# Patient Record
Sex: Female | Born: 2005 | Race: Black or African American | Hispanic: No | Marital: Single | Smoking: Never smoker
Health system: Southern US, Community
[De-identification: ages and names within clinical notes are randomized; demographics above are authoritative.]

---
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
## 2024-02-04 ENCOUNTER — Ambulatory Visit

## 2024-04-26 ENCOUNTER — Other Ambulatory Visit: Payer: Self-pay | Admitting: Physician Assistant

## 2024-04-26 DIAGNOSIS — N921 Excessive and frequent menstruation with irregular cycle: Secondary | ICD-10-CM

## 2024-04-29 ENCOUNTER — Ambulatory Visit
Admission: RE | Admit: 2024-04-29 | Discharge: 2024-04-29 | Disposition: A | Discharge status: Home / Self Care | Source: Ambulatory Visit | Attending: Physician Assistant | Admitting: Physician Assistant

## 2024-04-29 DIAGNOSIS — N921 Excessive and frequent menstruation with irregular cycle: Secondary | ICD-10-CM | POA: Diagnosis present

## 2024-05-31 ENCOUNTER — Emergency Department (HOSPITAL_COMMUNITY)
Admission: EM | Admit: 2024-05-31 | Discharge: 2024-05-31 | Disposition: A | Discharge status: Home / Self Care | Attending: Emergency Medicine | Admitting: Emergency Medicine

## 2024-05-31 ENCOUNTER — Encounter (HOSPITAL_COMMUNITY): Payer: Self-pay | Admitting: Emergency Medicine

## 2024-05-31 ENCOUNTER — Other Ambulatory Visit: Payer: Self-pay

## 2024-05-31 DIAGNOSIS — R202 Paresthesia of skin: Secondary | ICD-10-CM | POA: Insufficient documentation

## 2024-05-31 DIAGNOSIS — R1031 Right lower quadrant pain: Secondary | ICD-10-CM | POA: Diagnosis present

## 2024-05-31 DIAGNOSIS — R109 Unspecified abdominal pain: Secondary | ICD-10-CM

## 2024-05-31 LAB — CBC
HCT: 36.9 % (ref 36.0–46.0)
Hemoglobin: 11.4 g/dL — ABNORMAL LOW (ref 12.0–15.0)
MCH: 29.6 pg (ref 26.0–34.0)
MCHC: 30.9 g/dL (ref 30.0–36.0)
MCV: 95.8 fL (ref 80.0–100.0)
Platelets: 333 K/uL (ref 150–400)
RBC: 3.85 MIL/uL — ABNORMAL LOW (ref 3.87–5.11)
RDW: 11.9 % (ref 11.5–15.5)
WBC: 7.9 K/uL (ref 4.0–10.5)
nRBC: 0 % (ref 0.0–0.2)

## 2024-05-31 LAB — URINALYSIS, ROUTINE W REFLEX MICROSCOPIC
Bilirubin Urine: NEGATIVE
Glucose, UA: NEGATIVE mg/dL
Hgb urine dipstick: NEGATIVE
Ketones, ur: NEGATIVE mg/dL
Leukocytes,Ua: NEGATIVE
Nitrite: NEGATIVE
Protein, ur: NEGATIVE mg/dL
Specific Gravity, Urine: 1.026 (ref 1.005–1.030)
pH: 5 (ref 5.0–8.0)

## 2024-05-31 LAB — COMPREHENSIVE METABOLIC PANEL WITH GFR
ALT: 18 U/L (ref 0–44)
AST: 19 U/L (ref 15–41)
Albumin: 3.9 g/dL (ref 3.5–5.0)
Alkaline Phosphatase: 85 U/L (ref 38–126)
Anion gap: 8 (ref 5–15)
BUN: 13 mg/dL (ref 6–20)
CO2: 26 mmol/L (ref 22–32)
Calcium: 9.3 mg/dL (ref 8.9–10.3)
Chloride: 107 mmol/L (ref 98–111)
Creatinine, Ser: 0.7 mg/dL (ref 0.44–1.00)
GFR, Estimated: >60 mL/min (ref >60)
Glucose, Bld: 96 mg/dL (ref 70–99)
Potassium: 4 mmol/L (ref 3.5–5.1)
Sodium: 140 mmol/L (ref 135–145)
Total Bilirubin: 0.6 mg/dL (ref 0.0–1.2)
Total Protein: 6.9 g/dL (ref 6.5–8.1)

## 2024-05-31 LAB — LIPASE, BLOOD: Lipase: 21 U/L (ref 11–51)

## 2024-05-31 LAB — HCG, SERUM, QUALITATIVE: Preg, Serum: NEGATIVE

## 2024-05-31 NOTE — ED Provider Notes (Signed)
 [facility] Emergency Department Provider Note MRN:  [id number]  Arrival date & time: 05/31/24     Chief Complaint   Abdominal Pain   History of Present Illness   Sherri Sanchez is a 18 y.o. year-old female with no pertinent past medical history presenting to the ED with chief complaint of abdominal pain.  Right lower quadrant abdominal pain on and off for quite some time, became worse about 4 hours ago.  Worse than normal.  Also having paresthesias and tingling to the bilateral legs for the past 3 days.  This is also been going on intermittently for the past several weeks or months.  Denies fever, denies nausea or vomiting, no diarrhea or constipation, no vaginal bleeding or discharge, no dysuria or hematuria.  She put her symptoms in [employer] and now is concerned that she has cancer.  Review of Systems  A thorough review of systems was obtained and all systems are negative except as noted in the HPI and PMH.   Patient's Health History   History reviewed. No pertinent past medical history.  History reviewed. No pertinent surgical history.  History reviewed. No pertinent family history.  Social History   Socioeconomic History   Marital status: Single    Spouse name: Not on file   Number of children: Not on file   Years of education: Not on file   Highest education level: Not on file  Occupational History   Not on file  Tobacco Use   Smoking status: Never   Smokeless tobacco: Not on file  Substance and Sexual Activity   Alcohol use: Not Currently   Drug use: Not on file   Sexual activity: Not on file  Other Topics Concern   Not on file  Social History Narrative   Not on file   Social Drivers of Health   Financial Resource Strain: Low Risk  (05/05/2024)   Received from [facility]   Overall Financial Resource Strain (CARDIA)    Difficulty of Paying Living Expenses: Not hard at all  Food Insecurity: No Food Insecurity (05/05/2024)    Received from [facility]   Hunger Vital Sign    Within the past 12 months, you worried that your food would run out before you got the money to buy more.: Never true    Within the past 12 months, the food you bought just didn't last and you didn't have money to get more.: Never true  Transportation Needs: No Transportation Needs (05/05/2024)   Received from [facility] - Transportation    In the past 12 months, has lack of transportation kept you from medical appointments or from getting medications?: No    Lack of Transportation (Non-Medical): No  Physical Activity: Inactive (09/18/2023)   Received from [facility]   Exercise Vital Sign    On average, how many days per week do you engage in moderate to strenuous exercise (like a brisk walk)?: 0 days    On average, how many minutes do you engage in exercise at this level?: 0 min  Stress: Stress Concern Present (09/18/2023)   Received from [facility] - Occupational Stress Questionnaire    Feeling of Stress : To some extent  Social Connections: Socially Isolated (09/18/2023)   Received from [facility]   Social Connection and Isolation Panel    In a typical week, how many times do you  talk on the phone with family, friends, or neighbors?: Three times a week    How often do you get together with friends or relatives?: Three times a week    How often do you attend church or religious services?: Never    Do you belong to any clubs or organizations such as church groups, unions, fraternal or athletic groups, or school groups?: No    How often do you attend meetings of the clubs or organizations you belong to?: Never    Are you married, widowed, divorced, separated, never married, or living with a partner?: Never married  Intimate Partner Violence: Not At Risk (09/18/2023)   Received from [facility]   Humiliation, Afraid, Rape, and Kick  questionnaire    Within the last year, have you been afraid of your partner or ex-partner?: No    Within the last year, have you been humiliated or emotionally abused in other ways by your partner or ex-partner?: No    Within the last year, have you been kicked, hit, slapped, or otherwise physically hurt by your partner or ex-partner?: No    Within the last year, have you been raped or forced to have any kind of sexual activity by your partner or ex-partner?: No     Physical Exam   Vitals:   05/31/24 0031 05/31/24 0300  BP: 139/81 (!) 138/93  Pulse: 79 76  Resp: 18   Temp: 97.8 F (36.6 C)   SpO2: 100% 99%    CONSTITUTIONAL: Well-appearing, NAD NEURO/PSYCH:  Alert and oriented x 3, no focal deficits EYES:  eyes equal and reactive ENT/NECK:  no LAD, no JVD CARDIO: Regular rate, well-perfused, normal S1 and S2 PULM:  CTAB no wheezing or rhonchi GI/GU:  non-distended, non-tender MSK/SPINE:  No gross deformities, no edema SKIN:  no rash, atraumatic   *Additional and/or pertinent findings included in MDM below  Diagnostic and Interventional Summary    EKG Interpretation Date/Time:    Ventricular Rate:    PR Interval:    QRS Duration:    QT Interval:    QTC Calculation:   R Axis:      Text Interpretation:         Labs Reviewed  CBC - Abnormal; Notable for the following components:      Result Value   RBC 3.85 (*)    Hemoglobin 11.4 (*)    All other components within normal limits  LIPASE, BLOOD  COMPREHENSIVE METABOLIC PANEL WITH GFR  URINALYSIS, ROUTINE W REFLEX MICROSCOPIC  HCG, SERUM, QUALITATIVE    No orders to display    Medications - No data to display   Procedures  /  Critical Care Procedures  ED Course and Medical Decision Making  Initial Impression and Ddx Very well-appearing in no acute distress with normal vital signs.  Abdomen is reassuring, soft, nontender, no rebound guarding or rigidity.  Past medical/surgical history that increases  complexity of ED encounter: None  Interpretation of Diagnostics I personally reviewed the Laboratory Testing and my interpretation is as follows: No significant blood count or electrolyte disturbance.    Patient Reassessment and Ultimate Disposition/Management     Patient reassessed and abdominal exam continues to be reassuring, soft and nontender with no rebound guarding or rigidity.  Overall low concern for emergent process such as appendicitis.  Shared decision making utilized and CT imaging offered, patient prefers close follow-up and return precautions.  Appropriate for discharge.  Patient management required discussion with the following services or consulting  groups:  None  Complexity of Problems Addressed Acute illness or injury that poses threat of life of bodily function  Additional Data Reviewed and Analyzed Further history obtained from: Further history from spouse/family member  Additional Factors Impacting ED Encounter Risk Consideration of hospitalization  Ozell HERO. Theadore, MD [facility] Emergency Medicine [facility] [email or web address]  Final Clinical Impressions(s) / ED Diagnoses     ICD-10-CM   1. Abdominal pain, unspecified abdominal location  R10.9     2. Paresthesia  R20.2       ED Discharge Orders     None        Discharge Instructions Discussed with and Provided to Patient:     Discharge Instructions      You were evaluated in the Emergency Department and after careful evaluation, we did not find any emergent condition requiring admission or further testing in the hospital.  Your exam/testing today is overall reassuring.  Recommend follow-up with your primary care doctor to discuss her symptoms.  If you do not have a primary care doctor you can use [email or web address] to find one.  Please return to the Emergency Department if you experience any worsening of your condition.   Thank you for allowing us  to be a part  of your care.       Theadore Ozell HERO, MD 05/31/24 [phone]

## 2024-05-31 NOTE — Discharge Instructions (Signed)
 You were evaluated in the Emergency Department and after careful evaluation, we did not find any emergent condition requiring admission or further testing in the hospital.  Your exam/testing today is overall reassuring.  Recommend follow-up with your primary care doctor to discuss her symptoms.  If you do not have a primary care doctor you can use [email or web address] to find one.  Please return to the Emergency Department if you experience any worsening of your condition.   Thank you for allowing us  to be a part of your care.

## 2024-05-31 NOTE — ED Notes (Signed)
Recollect sent to lab

## 2024-05-31 NOTE — ED Triage Notes (Signed)
 Patient c/p RLQ abdominal pain and tingling sensation on bilateral legs x 1 day. Patient report nausea denies vomiting. Patient denies dysuria.

## 2024-08-09 ENCOUNTER — Emergency Department (HOSPITAL_COMMUNITY)
Admission: EM | Admit: 2024-08-09 | Discharge: 2024-08-10 | Disposition: A | Discharge status: Home / Self Care | Attending: Emergency Medicine | Admitting: Emergency Medicine

## 2024-08-09 ENCOUNTER — Other Ambulatory Visit: Payer: Self-pay

## 2024-08-09 ENCOUNTER — Encounter (HOSPITAL_COMMUNITY): Payer: Self-pay | Admitting: Emergency Medicine

## 2024-08-09 DIAGNOSIS — Z3A08 8 weeks gestation of pregnancy: Secondary | ICD-10-CM | POA: Diagnosis not present

## 2024-08-09 DIAGNOSIS — O26891 Other specified pregnancy related conditions, first trimester: Secondary | ICD-10-CM | POA: Diagnosis present

## 2024-08-09 DIAGNOSIS — R109 Unspecified abdominal pain: Secondary | ICD-10-CM | POA: Insufficient documentation

## 2024-08-09 LAB — COMPREHENSIVE METABOLIC PANEL WITH GFR
ALT: 9 U/L (ref 0–44)
AST: 16 U/L (ref 15–41)
Albumin: 4.2 g/dL (ref 3.5–5.0)
Alkaline Phosphatase: 87 U/L (ref 38–126)
Anion gap: 9 (ref 5–15)
BUN: 9 mg/dL (ref 6–20)
CO2: 22 mmol/L (ref 22–32)
Calcium: 9.5 mg/dL (ref 8.9–10.3)
Chloride: 104 mmol/L (ref 98–111)
Creatinine, Ser: 0.63 mg/dL (ref 0.44–1.00)
GFR, Estimated: >60 mL/min
Glucose, Bld: 97 mg/dL (ref 70–99)
Potassium: 3.6 mmol/L (ref 3.5–5.1)
Sodium: 135 mmol/L (ref 135–145)
Total Bilirubin: 0.7 mg/dL (ref 0.0–1.2)
Total Protein: 7.3 g/dL (ref 6.5–8.1)

## 2024-08-09 LAB — CBC
HCT: 38.2 % (ref 36.0–46.0)
Hemoglobin: 12.8 g/dL (ref 12.0–15.0)
MCH: 31 pg (ref 26.0–34.0)
MCHC: 33.5 g/dL (ref 30.0–36.0)
MCV: 92.5 fL (ref 80.0–100.0)
Platelets: 350 K/uL (ref 150–400)
RBC: 4.13 MIL/uL (ref 3.87–5.11)
RDW: 12.8 % (ref 11.5–15.5)
WBC: 9.7 K/uL (ref 4.0–10.5)
nRBC: 0 % (ref 0.0–0.2)

## 2024-08-09 LAB — URINALYSIS, ROUTINE W REFLEX MICROSCOPIC
Bilirubin Urine: NEGATIVE
Glucose, UA: NEGATIVE mg/dL
Hgb urine dipstick: NEGATIVE
Ketones, ur: NEGATIVE mg/dL
Leukocytes,Ua: NEGATIVE
Nitrite: NEGATIVE
Protein, ur: NEGATIVE mg/dL
Specific Gravity, Urine: 1.025 (ref 1.005–1.030)
pH: 6 (ref 5.0–8.0)

## 2024-08-09 LAB — LIPASE, BLOOD: Lipase: 19 U/L (ref 11–51)

## 2024-08-09 LAB — HCG, SERUM, QUALITATIVE: Preg, Serum: POSITIVE — AB

## 2024-08-09 NOTE — ED Notes (Signed)
 PT aware of urine sample. Urine cup in hand

## 2024-08-09 NOTE — ED Triage Notes (Signed)
 Pt reports abd pain x 1 week. Reports its generalized pain - not one spot. Denies n/v/d. States it feel like its burning rather than painful.

## 2024-08-10 ENCOUNTER — Emergency Department (HOSPITAL_COMMUNITY)

## 2024-08-10 NOTE — Discharge Instructions (Addendum)
 Ultrasound today with baby measuring approximately 8 weeks. Recommend to start some prenatals as soon as possible, make sure these have folic acid. Can use Tylenol as needed for pain.  Tums or Maalox are safe for heartburn symptoms in the stage of pregnancy. Please make sure to schedule follow-up with OB/GYN for ongoing prenatal care.  I have attached information for our local women's clinic as well if needed. Return here for new concerns.

## 2024-08-10 NOTE — ED Provider Notes (Signed)
 " [place] EMERGENCY DEPARTMENT AT [facility] Provider Note   CSN: [id number] Arrival date & time: 08/09/24  2055     Patient presents with: Abdominal Pain   Sherri Sanchez is a 18 y.o. female.   The history is provided by the patient and medical records.  Abdominal Pain  18 y.o. F presenting to the ED for 10 days of abdominal pain.  States it is generalized throughout her abdomen.  She reports burning pain throughout the abdomen.  States it feels like heartburn but it is lower down.  She denies nausea/vomiting.  Was eating chips in the lobby and symptoms got a little worse.  Reports normal BMs, denies urinary symptoms.  No fever/chills.  LMP October-- was 4 days in length.  It is normal for her to skip periods per her report.  Prior to Admission medications  Medication Sig Start Date End Date Taking? Authorizing Provider  cyclobenzaprine  (FLEXERIL ) 5 MG tablet Take 1 tablet (5 mg total) by mouth 3 (three) times daily as needed. 08/03/23   Margrette, Myah A, PA-C  salmeterol (SEREVENT  DISKUS) 50 MCG/DOSE diskus inhaler Inhale 1 puff into the lungs 2 (two) times daily as needed. 07/01/20   Woods, Jaclyn M, PA-C  sulfamethoxazole -trimethoprim  (BACTRIM  DS) 800-160 MG tablet Take 1 tablet by mouth 2 (two) times daily. 08/03/23   Margrette, Myah A, PA-C    Allergies: Amoxicillin    Review of Systems  Gastrointestinal:  Positive for abdominal pain.  All other systems reviewed and are negative.   Updated Vital Signs BP 136/78 (BP Location: Right Arm)   Pulse 77   Temp 98.2 F (36.8 C) (Oral)   Resp 17   LMP 06/15/2024 (Approximate)   SpO2 100%   Physical Exam Vitals and nursing note reviewed.  Constitutional:      Appearance: She is well-developed.  HENT:     Head: Normocephalic and atraumatic.  Eyes:     Conjunctiva/sclera: Conjunctivae normal.     Pupils: Pupils are equal, round, and reactive to light.  Cardiovascular:     Rate and Rhythm: Normal rate and  regular rhythm.     Heart sounds: Normal heart sounds.  Pulmonary:     Effort: Pulmonary effort is normal.     Breath sounds: Normal breath sounds.  Abdominal:     General: Bowel sounds are normal.     Palpations: Abdomen is soft.     Tenderness: There is no abdominal tenderness. There is no guarding or rebound.  Musculoskeletal:        General: Normal range of motion.     Cervical back: Normal range of motion.  Skin:    General: Skin is warm and dry.  Neurological:     Mental Status: She is alert and oriented to person, place, and time.     (all labs ordered are listed, but only abnormal results are displayed) Labs Reviewed  HCG, SERUM, QUALITATIVE - Abnormal; Notable for the following components:      Result Value   Preg, Serum POSITIVE (*)    All other components within normal limits  LIPASE, BLOOD  COMPREHENSIVE METABOLIC PANEL WITH GFR  CBC  URINALYSIS, ROUTINE W REFLEX MICROSCOPIC    EKG: None  Radiology: US  OB Comp Less 14 Wks Result Date: 08/10/2024 EXAM: ULTRASOUND FIRST TRIMESTER TECHNIQUE: Transabdominal first trimester obstetric pelvic duplex ultrasound was performed with real-time imaging, color flow Doppler imaging. COMPARISON: None available. CLINICAL HISTORY: new pregnancy, confirm IUP FINDINGS: UTERUS: No focal myometrial  mass. GESTATIONAL SAC(S): Single intrauterine gestational sac. No subchorionic hemorrhage. YOLK SAC: Present EMBRYO(<11WK) /FETUS(>=11WK): Single embryo. CROWN RUMP LENGTH: 16.6 mm. RATE OF CARDIAC ACTIVITY: 162 beats per minute. RIGHT OVARY: Not visualized. LEFT OVARY: Not visualized. FREE FLUID: No free fluid in the pelvis. MEASUREMENTS ESTIMATED GESTATIONAL AGE BY CURRENT ULTRASOUND: 8 weeks 0 day ESTIMATED GESTATIONAL AGE BY LMP/PRIOR ULTRASOUND: Not provided. ESTIMATED DUE DATE: 03/22/2025 IMPRESSION: 1. Single live intrauterine pregnancy as described. Electronically signed by: Norman Gatlin MD 08/10/2024 01:41 AM EST RP Workstation:  [id number]     Procedures   Medications Ordered in the ED - No data to display                                  Medical Decision Making Amount and/or Complexity of Data Reviewed Labs: ordered. Radiology: ordered and independent interpretation performed. ECG/medicine tests: ordered and independent interpretation performed.   18 year old female here with about 10 days of abdominal pain.  States it feels like burning sensation but lower in her abdomen than typical acid reflux.  Denies any nausea or vomiting.  No urinary symptoms.  LMP sometime in October, states it was normal.  She is afebrile and nontoxic in appearance here.  Her abdomen is soft and nontender without any peritoneal signs.  Labs were obtained here without any leukocytosis or electrolyte derangement.  UA without any signs of infection.  Pregnancy test is positive.  Ultrasound was obtained, measuring 8 weeks 0 days.  Heart rate 160s.  Patient has not had any bleeding or vaginal discharge.  She is stable for discharge.  I recommended that she start prenatal vitamins containing folic acid.  Safe for tylenol if needed for pain.  Tums/maalox for GERD like symptoms.  Close OB-GYN follow-up for ongoing prenatal care.  She states she has a provider she would like to see, given information for women's clinic as well if needed.  Can return to the ED for any new/acute changes.  Final diagnoses:  [redacted] weeks gestation of pregnancy    ED Discharge Orders     None          Jarold Olam HERO, PA-C 08/10/24 0234  "
